# Patient Record
Sex: Male | Born: 1960 | Race: Black or African American | Hispanic: No | Marital: Married | Smoking: Current some day smoker
Health system: Southern US, Community
[De-identification: ages and names within clinical notes are randomized; demographics above are authoritative.]

## PROBLEM LIST (undated history)

## (undated) DIAGNOSIS — B1920 Unspecified viral hepatitis C without hepatic coma: Secondary | ICD-10-CM

---

## 2016-10-27 ENCOUNTER — Encounter (HOSPITAL_COMMUNITY): Payer: Self-pay | Admitting: Emergency Medicine

## 2016-10-27 ENCOUNTER — Emergency Department (HOSPITAL_COMMUNITY)
Admission: EM | Admit: 2016-10-27 | Discharge: 2016-10-27 | Disposition: A | Discharge status: Home / Self Care | Payer: BLUE CROSS/BLUE SHIELD | Attending: Emergency Medicine | Admitting: Emergency Medicine

## 2016-10-27 ENCOUNTER — Emergency Department (HOSPITAL_COMMUNITY): Payer: BLUE CROSS/BLUE SHIELD

## 2016-10-27 DIAGNOSIS — S39011A Strain of muscle, fascia and tendon of abdomen, initial encounter: Secondary | ICD-10-CM | POA: Diagnosis not present

## 2016-10-27 DIAGNOSIS — Y939 Activity, unspecified: Secondary | ICD-10-CM | POA: Diagnosis not present

## 2016-10-27 DIAGNOSIS — S3991XA Unspecified injury of abdomen, initial encounter: Secondary | ICD-10-CM | POA: Diagnosis present

## 2016-10-27 DIAGNOSIS — M25562 Pain in left knee: Secondary | ICD-10-CM | POA: Insufficient documentation

## 2016-10-27 DIAGNOSIS — Y929 Unspecified place or not applicable: Secondary | ICD-10-CM | POA: Diagnosis not present

## 2016-10-27 DIAGNOSIS — X58XXXA Exposure to other specified factors, initial encounter: Secondary | ICD-10-CM | POA: Insufficient documentation

## 2016-10-27 DIAGNOSIS — F17200 Nicotine dependence, unspecified, uncomplicated: Secondary | ICD-10-CM | POA: Diagnosis not present

## 2016-10-27 DIAGNOSIS — T148XXA Other injury of unspecified body region, initial encounter: Secondary | ICD-10-CM

## 2016-10-27 DIAGNOSIS — G8929 Other chronic pain: Secondary | ICD-10-CM | POA: Diagnosis not present

## 2016-10-27 DIAGNOSIS — Y999 Unspecified external cause status: Secondary | ICD-10-CM | POA: Diagnosis not present

## 2016-10-27 HISTORY — DX: Unspecified viral hepatitis C without hepatic coma: B19.20

## 2016-10-27 LAB — URINALYSIS, ROUTINE W REFLEX MICROSCOPIC
BILIRUBIN URINE: NEGATIVE
GLUCOSE, UA: NEGATIVE mg/dL
Hgb urine dipstick: NEGATIVE
KETONES UR: NEGATIVE mg/dL
LEUKOCYTES UA: NEGATIVE
NITRITE: NEGATIVE
PROTEIN: NEGATIVE mg/dL
Specific Gravity, Urine: 1.02 (ref 1.005–1.030)
pH: 5 (ref 5.0–8.0)

## 2016-10-27 MED ORDER — NAPROXEN 500 MG PO TABS: 500.0000 mg | ORAL_TABLET | Freq: Two times a day (BID) | ORAL | 0 refills | Status: AC

## 2016-10-27 NOTE — ED Notes (Signed)
Pt is in stable condition upon d/c and ambulates from ED. 

## 2016-10-27 NOTE — ED Provider Notes (Signed)
[facility]-EMERGENCY DEPT Provider Note    By signing my name below, I, Earmon Phoenix, attest that this documentation has been prepared under the direction and in the presence of Melburn Hake, PA-C. Electronically Signed: Earmon Phoenix, ED Scribe. 10/27/16. 2:04 PM.    History   Chief Complaint Chief Complaint  Patient presents with  . Knee Pain  . Groin Pain    The history is provided by the patient and medical records. No language interpreter was used.    Brent Edwards is a 56 y.o. male with PMHx of Hepatitis C who presents to the Emergency Department complaining of left knee pain that began approximately one month ago. He states the pain began worsening in the past two days. Denies any recent fall, injury or trauma. He reports associated swelling of the left knee. Pt reports he has had similar pain and swelling in the knee before and was evaluated in [facility] where he received pain medication with improvement of sxs. Denies fever, redness, numbness, tingling, weakness, decreased ROM.   He also reports intermittent right groin pain that has been ongoing for approximately one year. Sometimes the pain radiates into the right inguinal area and he reports some pain gets near his right testicle. He reports associated increased urination. He has not taken anything for pain relief. Pt denies modifying factors of any complaint. He denies redness, swelling or pain of the penis, dysuria, abdominal pain, nausea, vomiting, penile drainage, rectal pain or bleeding, fever, chills, numbness, tingling or weakness of the LLE. He denies any previous surgeries, trauma or injury to the knee.    Past Medical History:  Diagnosis Date  . Hepatitis C     There are no active problems to display for this patient.   History reviewed. No pertinent surgical history.     Home Medications    Prior to Admission medications   Medication Sig Start Date End Date Taking? Authorizing Provider  naproxen  (NAPROSYN) 500 MG tablet Take 1 tablet (500 mg total) by mouth 2 (two) times daily. 10/27/16   Barrett Henle, PA-C    Family History No family history on file.  Social History Social History  Substance Use Topics  . Smoking status: Current Some Day Smoker  . Smokeless tobacco: Never Used  . Alcohol use Not on file     Allergies   Patient has no known allergies.   Review of Systems Review of Systems  Constitutional: Negative for chills and fever.  Gastrointestinal: Negative for abdominal pain, anal bleeding, nausea and vomiting.  Genitourinary: Positive for frequency and testicular pain. Negative for discharge, dysuria, penile pain, penile swelling and scrotal swelling.  Musculoskeletal: Positive for arthralgias and joint swelling.  Neurological: Negative for weakness and numbness.     Physical Exam Updated Vital Signs BP 127/87 (BP Location: Left Arm)   Pulse 72   Temp 99.1 F (37.3 C) (Oral)   Resp 16   SpO2 97%   Physical Exam  Constitutional: He is oriented to person, place, and time. He appears well-developed and well-nourished.  HENT:  Head: Normocephalic and atraumatic.  Mouth/Throat: Uvula is midline and oropharynx is clear and moist. No oral lesions. No oropharyngeal exudate.  Eyes: Conjunctivae and EOM are normal. Right eye exhibits no discharge. Left eye exhibits no discharge. No scleral icterus.  Cardiovascular: Normal rate, regular rhythm, normal heart sounds and intact distal pulses.   Pulmonary/Chest: Effort normal and breath sounds normal. No respiratory distress. He has no wheezes. He has no rales. He  exhibits no tenderness.  Abdominal: Soft. Bowel sounds are normal. He exhibits no distension and no mass. There is no tenderness. There is no rebound and no guarding. No hernia. Hernia confirmed negative in the right inguinal area and confirmed negative in the left inguinal area.  Genitourinary: Testes normal and penis normal. Right testis shows no  mass, no swelling and no tenderness. Left testis shows no mass, no swelling and no tenderness. Circumcised. No penile erythema or penile tenderness. No discharge found.  Genitourinary Comments: No tenderness to palpation over bilateral inguinal regions. No rash or lesions present.  Musculoskeletal: He exhibits tenderness. He exhibits no edema or deformity.       Left knee: He exhibits swelling. He exhibits normal range of motion, no effusion, no ecchymosis, no deformity, no laceration, no erythema, normal alignment, no LCL laxity, normal patellar mobility and no MCL laxity.       Left ankle: Normal.       Left lower leg: Normal.       Left foot: Normal.  Mild tenderness to palpation and swelling of left knee.  Lymphadenopathy: No inguinal adenopathy noted on the right or left side.  Neurological: He is alert and oriented to person, place, and time.  Skin: Skin is warm and dry. Capillary refill takes less than 2 seconds.  Nursing note and vitals reviewed.    ED Treatments / Results  DIAGNOSTIC STUDIES: Oxygen Saturation is 97% on RA, normal by my interpretation.   COORDINATION OF CARE: 1:26 PM- Will order urinalysis. Pt verbalizes understanding and agrees to plan.  Medications - No data to display  Labs (all labs ordered are listed, but only abnormal results are displayed) Labs Reviewed  URINALYSIS, ROUTINE W REFLEX MICROSCOPIC    EKG  EKG Interpretation None       Radiology Dg Knee Complete 4 Views Left  Result Date: 10/27/2016 CLINICAL DATA:  Left knee pain and swelling. No known injury. Initial encounter. EXAM: LEFT KNEE - COMPLETE 4+ VIEW COMPARISON:  None. FINDINGS: No evidence of fracture, dislocation, or joint effusion. No evidence of arthropathy or other focal bone abnormality. Soft tissues are unremarkable. IMPRESSION: Negative exam. Electronically Signed   By: Drusilla Kanner M.D.   On: 10/27/2016 12:38    Procedures Procedures (including critical care  time)  Medications Ordered in ED Medications - No data to display   Initial Impression / Assessment and Plan / ED Course  I have reviewed the triage vital signs and the nursing notes.  Pertinent labs & imaging results that were available during my care of the patient were reviewed by me and considered in my medical decision making (see chart for details).  Clinical Course     Pt presents with knee pain which he has had for the past month. Denies any recent fall, injury or trauma. VSS. Exam revealed mild tenderness and swelling to left knee, FROM with 5/5 strength, no ligament laxity present. No warmth, erythema or joint effusion present. Pt is able ambulate, no bony abnormality or deformity, no erythema or excessive heat, no evidence of cellulitis, DVT, or septic joint  Patient X-Ray negative for obvious fracture or dislocation.  Pt advised to follow up with orthopedics. Patient given NSAIDs while in ED, conservative therapy recommended and discussed. Patient will be discharged home & is agreeable with above plan. Returns precautions discussed. Pt appears safe for discharge.  Pt also presents with intermittent right groin pain for the past year with urinary frequency. Denies fever, penile d/c, penile  or testicular pain/swelling, abdominal pain, rash. VSS. Exam unremarkable, no abdominal tenderness. GU exam unremarkable, no rash, swelling, penile discharge or tenderness present over groin region. No hernia present. UA unremarkable. No tenderness to palpation of the testes or epididymis to suggest testicular torsion, orchitis or epididymitis. Do not suspect prostatitis or STD as pt is without rectal pain and UA unremarkable.  Suspect reported groin pain may be due to muscle strain. Plan to d/c pt home with symptomatic tx and outpatient PCP followup. Discussed results and plan for d/c with pt. Discussed return precautions.     I personally performed the services described in this documentation, which  was scribed in my presence. The recorded information has been reviewed and is accurate.   Final Clinical Impressions(s) / ED Diagnoses   Final diagnoses:  Chronic pain of left knee  Muscle strain    New Prescriptions Discharge Medication List as of 10/27/2016  2:53 PM    START taking these medications   Details  naproxen (NAPROSYN) 500 MG tablet Take 1 tablet (500 mg total) by mouth 2 (two) times daily., Starting Fri 10/27/2016, Print         KeachiNicole Elizabeth Daun Rens, PA-C 10/27/16 Paulo [id number]    Shaune Pollackameron Isaacs, MD 10/28/16 [phone]

## 2016-10-27 NOTE — Discharge Instructions (Signed)
Take your medication as prescribed as needed for pain relief. I recommend resting, elevating and applying ice to her left knee for 15-20 minutes 3-4 times daily. I recommend following up with your primary care provider in the next week to have her urine rechecked if he continued having urinary frequency. Please return to the Emergency Department if symptoms worsen or new onset of fever, abdominal pain, vomiting, rash, penile or testicular pain/swelling, penile discharge, pain with urination, blood in urine, numbness, tingling, weakness.

## 2016-10-27 NOTE — ED Triage Notes (Signed)
Groin pain x 4 months and  Left knee pain swollen x 3 weeks denies injury

## 2016-11-11 ENCOUNTER — Emergency Department (HOSPITAL_COMMUNITY)
Admission: EM | Admit: 2016-11-11 | Discharge: 2016-11-11 | Disposition: A | Discharge status: Home / Self Care | Payer: BLUE CROSS/BLUE SHIELD | Attending: Emergency Medicine | Admitting: Emergency Medicine

## 2016-11-11 ENCOUNTER — Encounter (HOSPITAL_COMMUNITY): Payer: Self-pay

## 2016-11-11 DIAGNOSIS — M25562 Pain in left knee: Secondary | ICD-10-CM

## 2016-11-11 DIAGNOSIS — Z79899 Other long term (current) drug therapy: Secondary | ICD-10-CM | POA: Insufficient documentation

## 2016-11-11 DIAGNOSIS — F17200 Nicotine dependence, unspecified, uncomplicated: Secondary | ICD-10-CM | POA: Insufficient documentation

## 2016-11-11 MED ORDER — TRAMADOL HCL 50 MG PO TABS: 50.0000 mg | ORAL_TABLET | Freq: Four times a day (QID) | ORAL | 0 refills | Status: AC | PRN

## 2016-11-11 NOTE — Discharge Instructions (Signed)
Medications: Tramadol  Treatment: Take tramadol every 6 hours as needed for severe pain. Do not drive or operate machinery when taking this medication. You can take ibuprofen or Naprosyn as prescribed over-the-counter when you're not taking tramadol. Use ice 3-4 times daily alternating 20 minutes on, 20 minutes off. Wear knee sleeve at all times except when bathing for support and to help with swelling. Elevate your leg whenever you are not walking on it.  Follow-up: Please follow-up with the orthopedic doctor, Dr. Linna CapriceSwinteck, for further evaluation and treatment of your knee pain. Please return to the emergency department if you develop any new or worsening symptoms.

## 2016-11-11 NOTE — ED Provider Notes (Signed)
[facility]-EMERGENCY DEPT Provider Note   CSN: [id number] Arrival date & time: 11/11/16  [id number]   By signing my name below, I, Orpah CobbMaurice Copeland, attest that this documentation has been prepared under the direction and in the presence of Buel ReamAlexandra Joscelyn Hardrick, PA-C. Electronically Signed: Orpah CobbMaurice Copeland , ED Scribe. 10/30/16. 4:20 PM.    History   Chief Complaint No chief complaint on file.   HPI  Brent Edwards is a 56 y.o. male who presents to the Emergency Department complaining of constant, mild to moderate L knee pain with sudden onset x1 month. Pt states that he has had ongoing swelling to the L knee for the past x1 month.  Pt reports heartburn feeling with use of the prescribed medication, naprosyn. Pt states that the medication he was prescribed x2 weeks ago has provided no relief. Pt denies fever, numbness, tingling, chest pain, nausea, vomiting, abdominal pain, urinary symptoms. Of note, pt states that he has not found a PCP due to his work schedule.  Denies injury or trauma.   The history is provided by the patient. No language interpreter was used.    Past Medical History:  Diagnosis Date  . Hepatitis C     There are no active problems to display for this patient.   History reviewed. No pertinent surgical history.     Home Medications    Prior to Admission medications   Medication Sig Start Date End Date Taking? Authorizing Provider  naproxen (NAPROSYN) 500 MG tablet Take 1 tablet (500 mg total) by mouth 2 (two) times daily. 10/27/16   Barrett HenleNicole Elizabeth Nadeau, PA-C  traMADol (ULTRAM) 50 MG tablet Take 1 tablet (50 mg total) by mouth every 6 (six) hours as needed. 11/11/16   Emi HolesAlexandra M Shiela Bruns, PA-C    Family History No family history on file.  Social History Social History  Substance Use Topics  . Smoking status: Current Some Day Smoker  . Smokeless tobacco: Never Used  . Alcohol use Not on file     Allergies   Patient has no known allergies.   Review of  Systems Review of Systems  Cardiovascular: Negative for chest pain.  Gastrointestinal: Negative for abdominal pain, nausea and vomiting.  Musculoskeletal: Positive for arthralgias (L knee) and joint swelling.  Neurological: Negative for numbness.     Physical Exam Updated Vital Signs BP 150/91 (BP Location: Left Arm)   Pulse 65   Temp 97.5 F (36.4 C) (Oral)   Resp 18   Ht 6' (1.829 m)   Wt 97.5 kg   SpO2 100%   BMI 29.16 kg/m   Physical Exam  Constitutional: He appears well-developed and well-nourished. No distress.  HENT:  Head: Normocephalic and atraumatic.  Mouth/Throat: Oropharynx is clear and moist. No oropharyngeal exudate.  Eyes: Conjunctivae are normal. Pupils are equal, round, and reactive to light. Right eye exhibits no discharge. Left eye exhibits no discharge. No scleral icterus.  Neck: Normal range of motion. Neck supple. No thyromegaly present.  Cardiovascular: Normal rate, regular rhythm, normal heart sounds and intact distal pulses.  Exam reveals no gallop and no friction rub.   No murmur heard. Pulmonary/Chest: Effort normal and breath sounds normal. No stridor. No respiratory distress. He has no wheezes. He has no rales.  Abdominal: Soft. Bowel sounds are normal. He exhibits no distension. There is no tenderness. There is no rebound and no guarding.  Musculoskeletal: He exhibits no edema.  L knee: Mild superior swelling with mild warmth, tenderness to anterior and lateral joint line;  negative anterior/posterior drawer, no laxity with varus or valgus stress, but some pain over the medial joint line, no erythema or excessive heating; full passive range of motion without pain  Lymphadenopathy:    He has no cervical adenopathy.  Neurological: He is alert. Coordination normal.  Skin: Skin is warm and dry. No rash noted. He is not diaphoretic. No pallor.  Psychiatric: He has a normal mood and affect.  Nursing note and vitals reviewed.    ED Treatments /  Results   DIAGNOSTIC STUDIES: Oxygen Saturation is 100% on RA, normal by my interpretation.   COORDINATION OF CARE: 12:41 PM-Discussed next steps with pt. Pt verbalized understanding and is agreeable with the plan.    Labs (all labs ordered are listed, but only abnormal results are displayed) Labs Reviewed - No data to display  EKG  EKG Interpretation None       Radiology No results found.  Procedures Procedures (including critical care time)  Medications Ordered in ED Medications - No data to display   Initial Impression / Assessment and Plan / ED Course  I have reviewed the triage vital signs and the nursing notes.  Pertinent labs & imaging results that were available during my care of the patient were reviewed by me and considered in my medical decision making (see chart for details).     Patient returns with knee pain. Negative imaging 2 weeks ago. Patient given Naprosyn without relief. Suspect possible patellofemoral syndrome or meniscal injury. No signs of septic joint or other emergent pathology. Patient given knee sleeve today. Discharge home with tramadol. I reviewed and see narcotic database and found no discrepancies. Patient encouraged to use ice and elevation. Encouraged to follow up with orthopedics as soon as possible for further evaluation and treatment. Return precautions discussed. Patient understands and agrees with plan. Patient vitals stable throughout ED course and discharged in satisfactory condition.  Final Clinical Impressions(s) / ED Diagnoses   Final diagnoses:  Acute pain of left knee    New Prescriptions Discharge Medication List as of 11/11/2016 10:45 AM    START taking these medications   Details  traMADol (ULTRAM) 50 MG tablet Take 1 tablet (50 mg total) by mouth every 6 (six) hours as needed., Starting Sat 11/11/2016, Print      I personally performed the services described in this documentation, which was scribed in my presence.  The recorded information has been reviewed and is accurate.     Emi Holes, PA-C 11/11/16 [id number]    Alvira Monday, MD 11/14/16 [phone]

## 2016-11-11 NOTE — ED Triage Notes (Signed)
Patient here with \\ongoing  left knee pain with ambulation and swelling. Seen 2 weeks ago and had negative xray, denies new trauma

## 2017-08-04 ENCOUNTER — Emergency Department (HOSPITAL_COMMUNITY): Payer: Self-pay

## 2017-08-04 ENCOUNTER — Encounter (HOSPITAL_COMMUNITY): Payer: Self-pay | Admitting: *Deleted

## 2017-08-04 ENCOUNTER — Emergency Department (HOSPITAL_COMMUNITY)
Admission: EM | Admit: 2017-08-04 | Discharge: 2017-08-04 | Disposition: A | Discharge status: Home / Self Care | Payer: Self-pay | Attending: Emergency Medicine | Admitting: Emergency Medicine

## 2017-08-04 DIAGNOSIS — Z79899 Other long term (current) drug therapy: Secondary | ICD-10-CM | POA: Insufficient documentation

## 2017-08-04 DIAGNOSIS — F17200 Nicotine dependence, unspecified, uncomplicated: Secondary | ICD-10-CM | POA: Insufficient documentation

## 2017-08-04 DIAGNOSIS — G44209 Tension-type headache, unspecified, not intractable: Secondary | ICD-10-CM | POA: Insufficient documentation

## 2017-08-04 DIAGNOSIS — N50819 Testicular pain, unspecified: Secondary | ICD-10-CM | POA: Insufficient documentation

## 2017-08-04 LAB — URINALYSIS, ROUTINE W REFLEX MICROSCOPIC
BILIRUBIN URINE: NEGATIVE
Bacteria, UA: NONE SEEN
GLUCOSE, UA: NEGATIVE mg/dL
Ketones, ur: NEGATIVE mg/dL
Leukocytes, UA: NEGATIVE
Nitrite: NEGATIVE
Protein, ur: NEGATIVE mg/dL
Specific Gravity, Urine: 1.02 (ref 1.005–1.030)
Squamous Epithelial / LPF: NONE SEEN
pH: 5 (ref 5.0–8.0)

## 2017-08-04 LAB — CBC WITH DIFFERENTIAL/PLATELET
Basophils Absolute: 0 10*3/uL (ref 0.0–0.1)
Basophils Relative: 0 %
EOS ABS: 0.2 10*3/uL (ref 0.0–0.7)
EOS PCT: 3 %
HCT: 38.3 % — ABNORMAL LOW (ref 39.0–52.0)
HEMOGLOBIN: 12.2 g/dL — AB (ref 13.0–17.0)
LYMPHS ABS: 2.2 10*3/uL (ref 0.7–4.0)
LYMPHS PCT: 28 %
MCH: 31 pg (ref 26.0–34.0)
MCHC: 31.9 g/dL (ref 30.0–36.0)
MCV: 97.5 fL (ref 78.0–100.0)
MONOS PCT: 6 %
Monocytes Absolute: 0.5 10*3/uL (ref 0.1–1.0)
Neutro Abs: 5 10*3/uL (ref 1.7–7.7)
Neutrophils Relative %: 63 %
Platelets: 248 10*3/uL (ref 150–400)
RBC: 3.93 MIL/uL — AB (ref 4.22–5.81)
RDW: 12.9 % (ref 11.5–15.5)
WBC: 8 10*3/uL (ref 4.0–10.5)

## 2017-08-04 LAB — BASIC METABOLIC PANEL
Anion gap: 8 (ref 5–15)
BUN: 13 mg/dL (ref 6–20)
CO2: 27 mmol/L (ref 22–32)
Calcium: 9.2 mg/dL (ref 8.9–10.3)
Chloride: 100 mmol/L — ABNORMAL LOW (ref 101–111)
Creatinine, Ser: 1.18 mg/dL (ref 0.61–1.24)
GFR calc Af Amer: >60 mL/min (ref >60)
GFR calc non Af Amer: >60 mL/min (ref >60)
Glucose, Bld: 94 mg/dL (ref 65–99)
POTASSIUM: 3.8 mmol/L (ref 3.5–5.1)
SODIUM: 135 mmol/L (ref 135–145)

## 2017-08-04 NOTE — ED Provider Notes (Signed)
[facility] EMERGENCY DEPARTMENT Provider Note   CSN: [id number] Arrival date & time: 08/04/17  4098     History   Chief Complaint Chief Complaint  Patient presents with  . Testicle Pain    HPI Brent Edwards is a 56 y.o. male.  Patient is a 56 year old male with a history of hepatitis C who presents with multiple complaints. His main complaint is some pain in his right testicular and inguinal area. This is been going on for about 4 months.  He states it's been constant throbbing. He denies any lumps or bulges in his inguinal area. He has urinary frequency but denies any burning or pain on urination. No pain with bowel movements. He also has low back pain. He describes as aching across his low back it's also been going on for several months. He does have a history of chronic back problems with prior back surgery. He denies any radiation down his legs. No numbness or weakness to his extremities. No loss of bowel or bladder control. No fevers. It's worse with movement. He also states he's been having constant daily headaches for about 4 months. It's a bifrontal-type headache. Occasionally has some tingling in his right hand. No weakness in his extremities. No associated neck pain. No vision changes. No speech deficits. No recent head trauma. He states it's constant and unrelenting. He denies taking any over-the-counter medications for symptoms.      Past Medical History:  Diagnosis Date  . Hepatitis C     There are no active problems to display for this patient.   History reviewed. No pertinent surgical history.     Home Medications    Prior to Admission medications   Medication Sig Start Date End Date Taking? Authorizing Provider  Glucosamine-Chondroit-MSM-C-Mn (FLEXI JOINT PO) Take 1 tablet by mouth daily.   Yes [provider]  Omega-3 Fatty Acids (OMEGA 3 PO) Take 1 tablet by mouth 2 (two) times daily.   Yes [provider]    naproxen (NAPROSYN) 500 MG tablet Take 1 tablet (500 mg total) by mouth 2 (two) times daily. Patient not taking: Reported on 08/04/2017 10/27/16   Barrett Henle, PA-C  traMADol (ULTRAM) 50 MG tablet Take 1 tablet (50 mg total) by mouth every 6 (six) hours as needed. Patient not taking: Reported on 08/04/2017 11/11/16   Emi Holes, PA-C    Family History History reviewed. No pertinent family history.  Social History Social History  Substance Use Topics  . Smoking status: Current Some Day Smoker  . Smokeless tobacco: Never Used  . Alcohol use Not on file     Allergies   Other   Review of Systems Review of Systems  Constitutional: Negative for chills, diaphoresis, fatigue and fever.  HENT: Negative for congestion, rhinorrhea and sneezing.   Eyes: Negative.   Respiratory: Negative for cough, chest tightness and shortness of breath.   Cardiovascular: Negative for chest pain and leg swelling.  Gastrointestinal: Negative for abdominal pain, blood in stool, diarrhea, nausea and vomiting.  Genitourinary: Positive for frequency and testicular pain. Negative for difficulty urinating, flank pain and hematuria.  Musculoskeletal: Positive for back pain. Negative for arthralgias.  Skin: Negative for rash.  Neurological: Positive for numbness and headaches. Negative for dizziness, speech difficulty and weakness.     Physical Exam Updated Vital Signs BP (!) 142/89   Pulse 64   Temp 98.3 F (36.8 C) (Oral)   Resp 16   Ht 6' (1.829 m)  Wt 83.9 kg (185 lb)   SpO2 99%   BMI 25.09 kg/m   Physical Exam  Constitutional: He is oriented to person, place, and time. He appears well-developed and well-nourished.  HENT:  Head: Normocephalic and atraumatic.  Eyes: Pupils are equal, round, and reactive to light.  Neck: Normal range of motion. Neck supple.  No meningismus  Cardiovascular: Normal rate, regular rhythm and normal heart sounds.   Pulmonary/Chest: Effort normal  and breath sounds normal. No respiratory distress. He has no wheezes. He has no rales. He exhibits no tenderness.  Abdominal: Soft. Bowel sounds are normal. There is no tenderness. There is no rebound and no guarding.  Genitourinary:  Genitourinary Comments: Patient has normal-appearing external male genitalia. There is tenderness in the right testicle. No masses are noted. No hernias are palpated.  Musculoskeletal: Normal range of motion. He exhibits no edema.  Patient has some mild generalized soreness to his most which are in the lumbar region bilaterally, negative straight leg raise bilaterally. Pedal pulses are intact.  Lymphadenopathy:    He has no cervical adenopathy.  Neurological: He is alert and oriented to person, place, and time.  Motor 5/5 all extremities Sensation grossly intact to LT all extremities Finger to Nose intact, no pronator drift CN II-XII grossly intact    Skin: Skin is warm and dry. No rash noted.  Psychiatric: He has a normal mood and affect.     ED Treatments / Results  Labs (all labs ordered are listed, but only abnormal results are displayed) Labs Reviewed  URINALYSIS, ROUTINE W REFLEX MICROSCOPIC - Abnormal; Notable for the following:       Result Value   Hgb urine dipstick SMALL (*)    All other components within normal limits  BASIC METABOLIC PANEL - Abnormal; Notable for the following:    Chloride 100 (*)    All other components within normal limits  CBC WITH DIFFERENTIAL/PLATELET - Abnormal; Notable for the following:    RBC 3.93 (*)    Hemoglobin 12.2 (*)    HCT 38.3 (*)    All other components within normal limits  HIV ANTIBODY (ROUTINE TESTING)    EKG  EKG Interpretation None       Radiology Ct Head Wo Contrast  Result Date: 08/04/2017 CLINICAL DATA:  Chronic headache. EXAM: CT HEAD WITHOUT CONTRAST TECHNIQUE: Contiguous axial images were obtained from the base of the skull through the vertex without intravenous contrast.  COMPARISON:  None. FINDINGS: Brain: No evidence of acute infarction, hemorrhage, hydrocephalus, extra-axial collection, or mass lesion/mass effect. Vascular:  No hyperdense vessel or other acute findings. Skull: No evidence of fracture or other significant bone abnormality. Sinuses/Orbits:  No acute findings. Other: None. IMPRESSION: Negative noncontrast head CT. Electronically Signed   By: Myles Rosenthal M.D.   On: 08/04/2017 11:02   US Scrotum  Result Date: 08/04/2017 CLINICAL DATA:  Right testicular pain and tenderness 4 months. EXAM: SCROTAL ULTRASOUND DOPPLER ULTRASOUND OF THE TESTICLES TECHNIQUE: Complete ultrasound examination of the testicles, epididymis, and other scrotal structures was performed. Color and spectral Doppler ultrasound were also utilized to evaluate blood flow to the testicles. COMPARISON:  None. FINDINGS: Right testicle Measurements: 4.6 x 2.7 x 3.5 cm. No mass or microlithiasis visualized. Left testicle Measurements: 4.1 x 3.0 x 2.7 cm. No mass or microlithiasis visualized. Right epididymis:  Normal in size and appearance. Left epididymis: A 1.6 cm spermatocele or cyst is seen in the left epididymal head. Hydrocele:  None visualized. Varicocele: Small bilateral  varicoceles which show increased blood flow during Valsalva maneuver. Pulsed Doppler interrogation of both testes demonstrates normal low resistance arterial and venous waveforms bilaterally. IMPRESSION: No evidence of testicular mass or torsion. Small bilateral varicoceles. 1.6 cm cyst or spermatocele in the left epididymal head. Electronically Signed   By: Myles RosenthalJohn  Stahl M.D.   On: 08/04/2017 11:00   [place] Art/ven Flow Abd Pelv Doppler  Result Date: 08/04/2017 CLINICAL DATA:  Right testicular pain and tenderness 4 months. EXAM: SCROTAL ULTRASOUND DOPPLER ULTRASOUND OF THE TESTICLES TECHNIQUE: Complete ultrasound examination of the testicles, epididymis, and other scrotal structures was performed. Color and spectral Doppler  ultrasound were also utilized to evaluate blood flow to the testicles. COMPARISON:  None. FINDINGS: Right testicle Measurements: 4.6 x 2.7 x 3.5 cm. No mass or microlithiasis visualized. Left testicle Measurements: 4.1 x 3.0 x 2.7 cm. No mass or microlithiasis visualized. Right epididymis:  Normal in size and appearance. Left epididymis: A 1.6 cm spermatocele or cyst is seen in the left epididymal head. Hydrocele:  None visualized. Varicocele: Small bilateral varicoceles which show increased blood flow during Valsalva maneuver. Pulsed Doppler interrogation of both testes demonstrates normal low resistance arterial and venous waveforms bilaterally. IMPRESSION: No evidence of testicular mass or torsion. Small bilateral varicoceles. 1.6 cm cyst or spermatocele in the left epididymal head. Electronically Signed   By: Myles RosenthalJohn  Stahl M.D.   On: 08/04/2017 11:00    Procedures Procedures (including critical care time)  Medications Ordered in ED Medications - No data to display   Initial Impression / Assessment and Plan / ED Course  I have reviewed the triage vital signs and the nursing notes.  Pertinent labs & imaging results that were available during my care of the patient were reviewed by me and considered in my medical decision making (see chart for details).     Patient is a [age] male who presents with multiple complaints. He has testicular pain. There is no hernia palpated. No evidence of torsion. No evidence of epididymitis. This is been going on for about 4 months. Don't feel that antibiotics are indicated. His urine does not appear to be infected. He denies any penile discharge. He does request an HIV test but denies any other symptoms that would be more consistent for an STD. He was given a urology referral for follow-up if his symptoms continue. He also has back pain which seems more chronic in nature. He has no neurologic deficits or signs of cauda equina. No radicular symptoms. This can be  managed symptomatically. He also has a four-month history of bifrontal headache. No neurologic deficits however given the ongoing unrelenting nature of the symptoms, a head CT was performed which shows no evidence of mass. He was discharged home in good condition. I did stress the need for primary care establishment. He was given a resource guide for possible outpatient follow-up. Return precautions were given.  Final Clinical Impressions(s) / ED Diagnoses   Final diagnoses:  Testicle pain  Tension-type headache, not intractable, unspecified chronicity pattern    New Prescriptions New Prescriptions   No medications on file     Rolan BuccoBelfi, Ransom Nickson, MD 08/04/17 1240

## 2017-08-04 NOTE — ED Notes (Signed)
Pt to US.

## 2017-08-04 NOTE — Discharge Instructions (Signed)
PLEASE TRY TO ESTABLISH CARE WITH A PRIMARY CARE DOCTOR.  MAKE APPOINTMENT WITH A UROLOGIST IF YOU TESTICULAR PAIN CONTINUES.  RETURN HERE IF YOU HAVE WORSENING SYMPTOMS.

## 2017-08-04 NOTE — ED Triage Notes (Signed)
PT reports testicular pain for months . Pt denies any injury. Pt reports frquency .

## 2017-08-05 LAB — HIV ANTIBODY (ROUTINE TESTING W REFLEX): HIV Screen 4th Generation wRfx: NONREACTIVE

## 2019-01-18 IMAGING — US US SCROTUM
1 series · 14 of 25 positions shown · non-contrast
Comparison: None.

CLINICAL DATA: Right testicular pain and tenderness 4 months.

EXAM:
SCROTAL ULTRASOUND
DOPPLER ULTRASOUND OF THE TESTICLES
TECHNIQUE: Complete ultrasound examination of the testicles, epididymis, and
other scrotal structures was performed. Color and spectral Doppler
ultrasound were also utilized to evaluate blood flow to the
testicles.

[Series 1: us scrotum · 0.06mm/px · 14 of 59 slices shown]
[im 1/59]
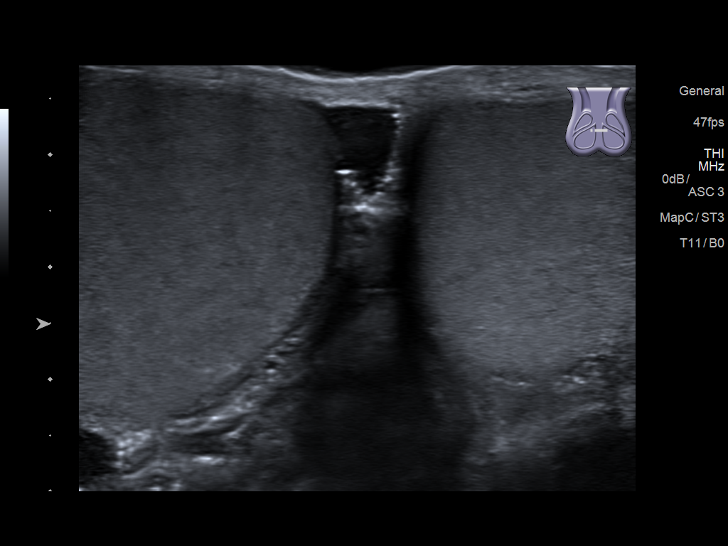
[im 5/59]
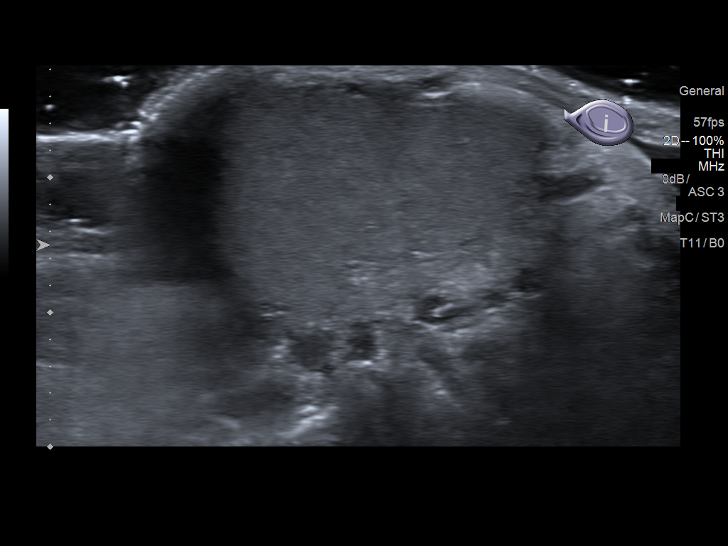
[im 10/59]
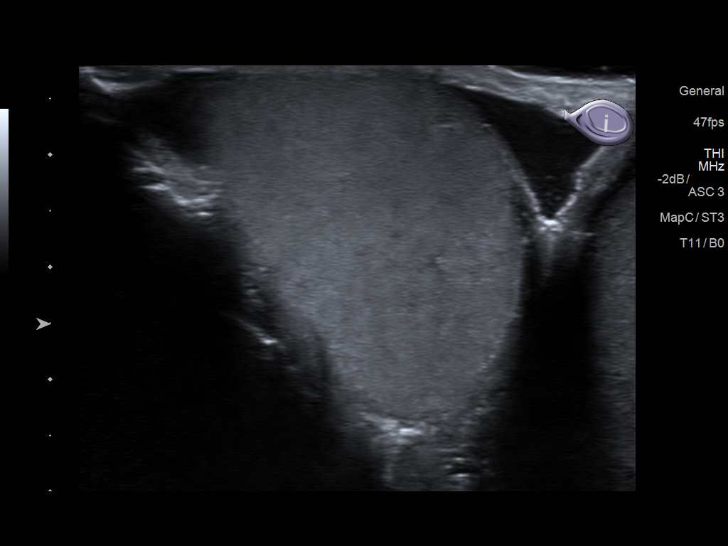
[im 15/59]
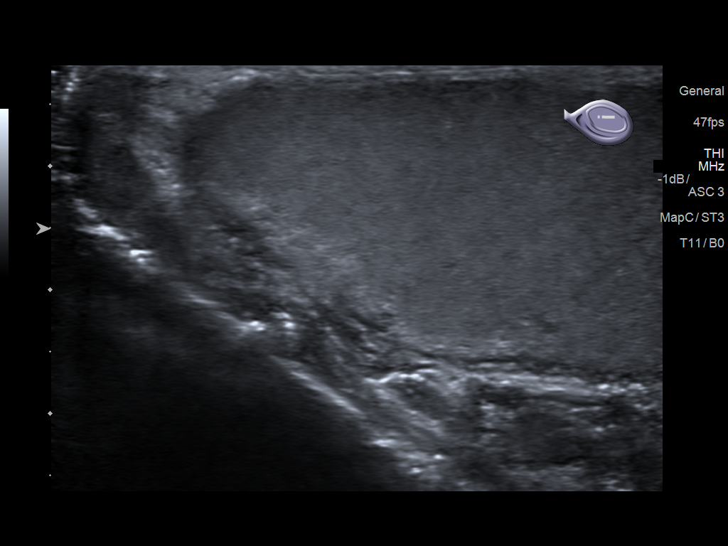
[im 20/59]
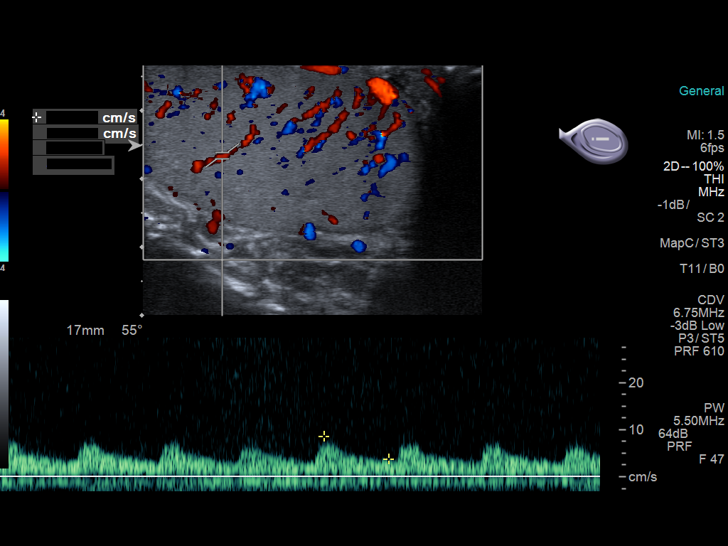
[im 22/59]
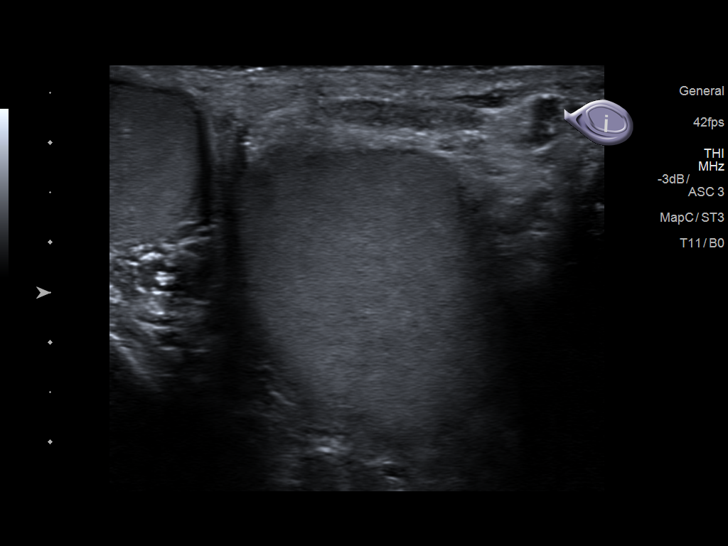
[im 27/59]
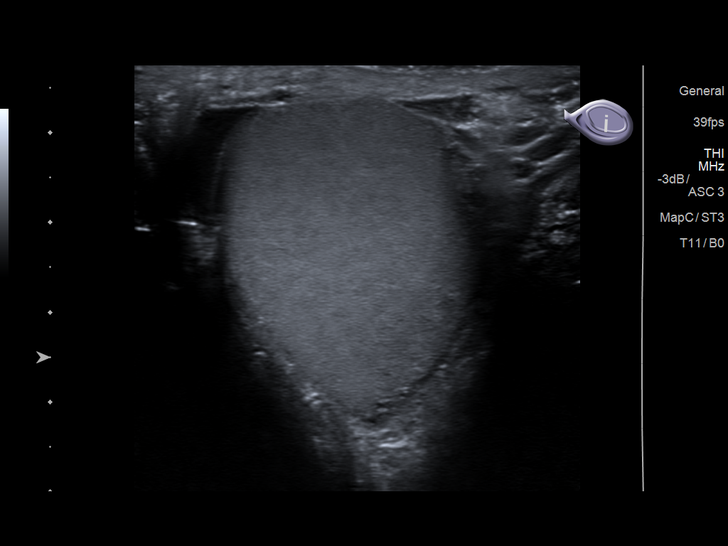
[im 32/59]
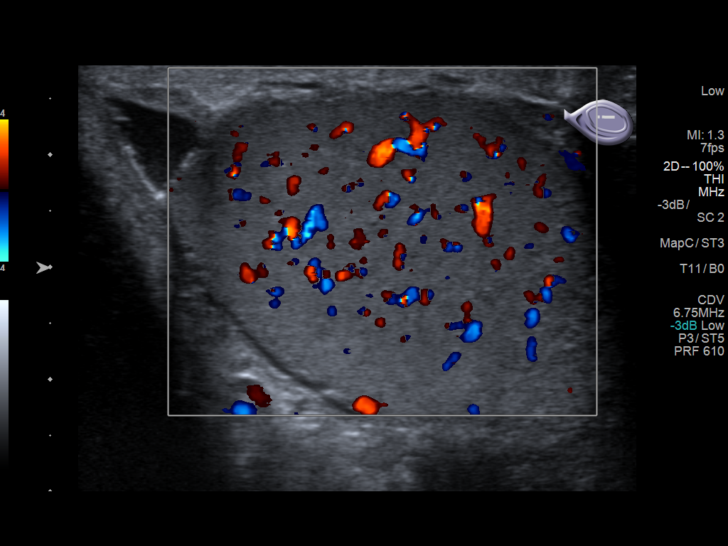
[im 37/59]
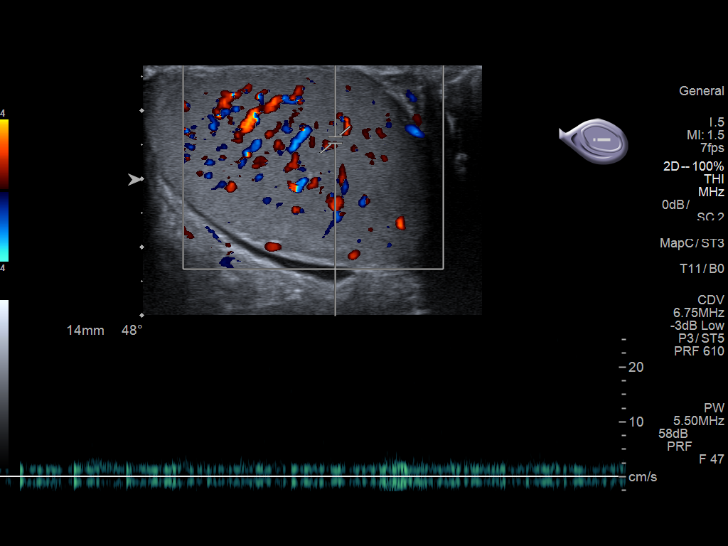
[im 39/59]
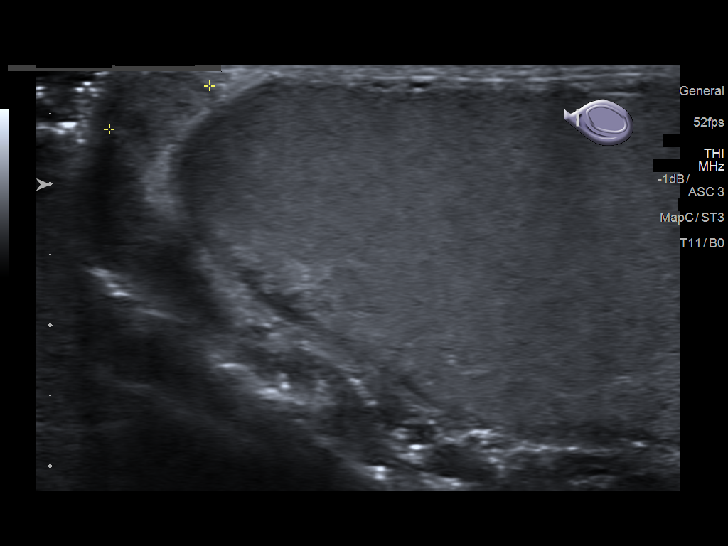
[im 44/59]
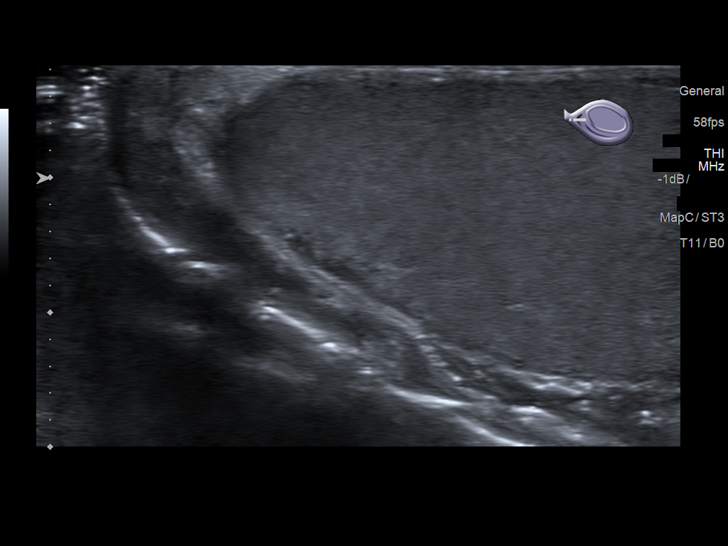
[im 49/59]
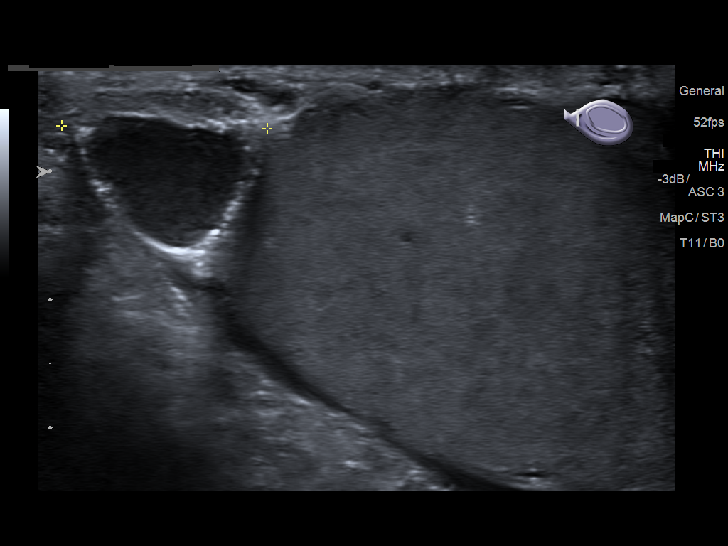
[im 54/59]
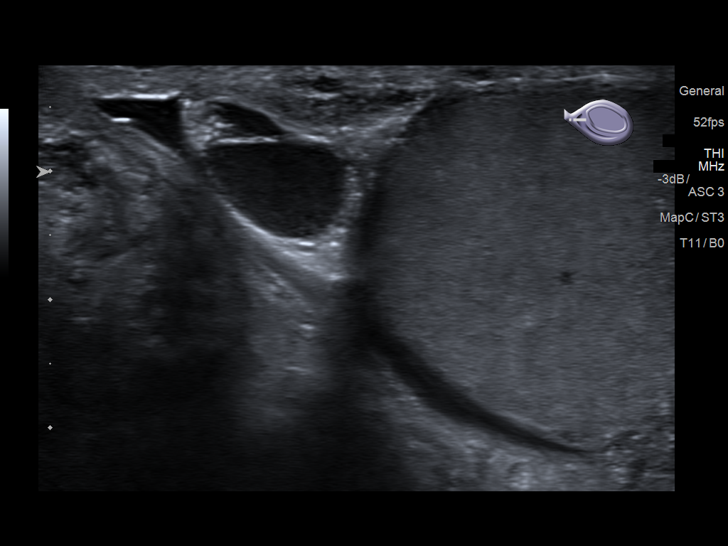
[im 59/59]
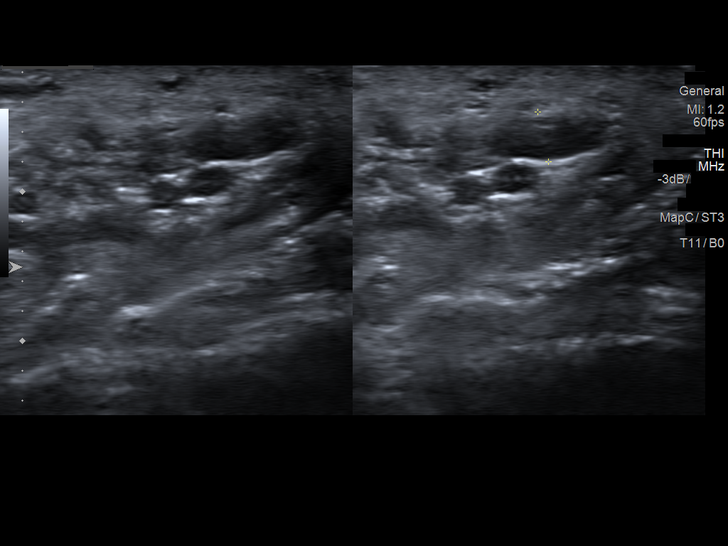

[14 of 25 positions shown; findings below may reference images not displayed]

FINDINGS: Right testicle

Measurements: 4.6 x 2.7 x 3.5 cm. No mass or microlithiasis
visualized.

Left testicle

Measurements: 4.1 x 3.0 x 2.7 cm. No mass or microlithiasis
visualized.

Right epididymis:  Normal in size and appearance.

Left epididymis: A 1.6 cm spermatocele or cyst is seen in the left
epididymal head.

Hydrocele:  None visualized.

Varicocele: Small bilateral varicoceles which show increased blood
flow during Valsalva maneuver.

Pulsed Doppler interrogation of both testes demonstrates normal low
resistance arterial and venous waveforms bilaterally.
IMPRESSION: No evidence of testicular mass or torsion.

Small bilateral varicoceles.

1.6 cm cyst or spermatocele in the left epididymal head.

## 2019-01-25 IMAGING — CT CT HEAD W/O CM
4 series · 17 of 47 positions shown, 19 images · non-contrast
Comparison: None.

CLINICAL DATA: Chronic headache.

EXAM:
CT HEAD WITHOUT CONTRAST
TECHNIQUE: Contiguous axial images were obtained from the base of the skull
through the vertex without intravenous contrast.

[Series 3: head without · axial · non-contrast · 0.43mm/px · z∈[-63,+62]mm · 7 of 35 slices shown, 9 images]
[im 5/35  brain]
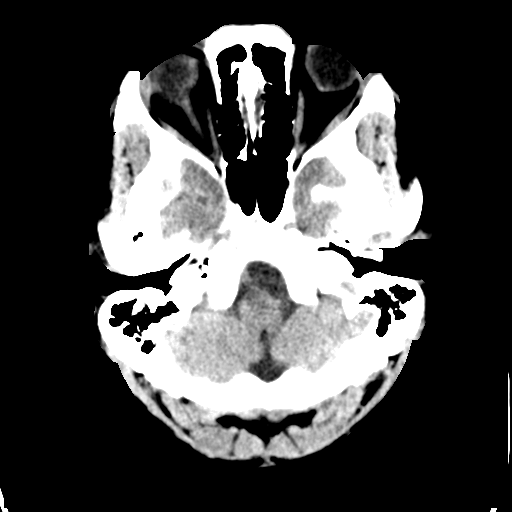
[im 5/35  bone]
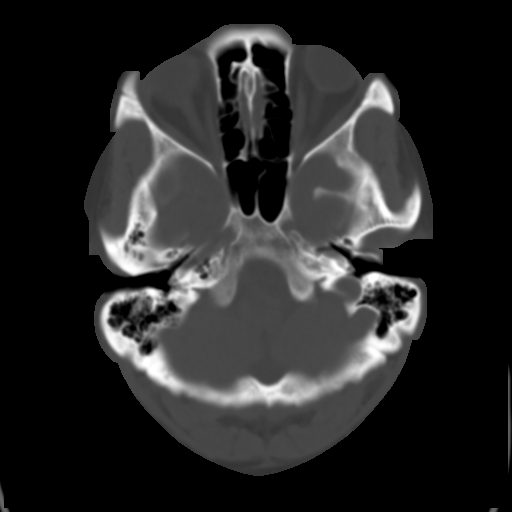
[im 9/35  brain]
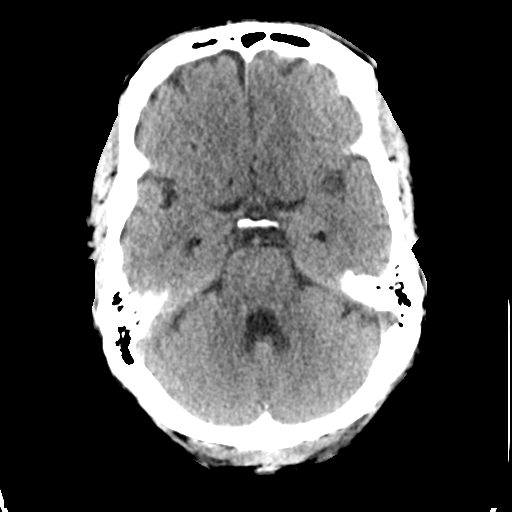
[im 13/35  brain]
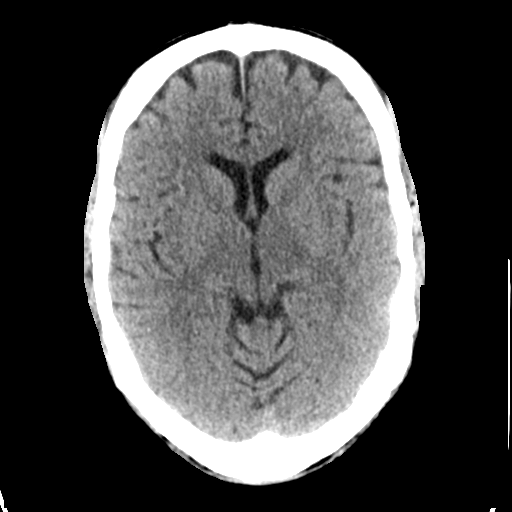
[im 18/35  brain]
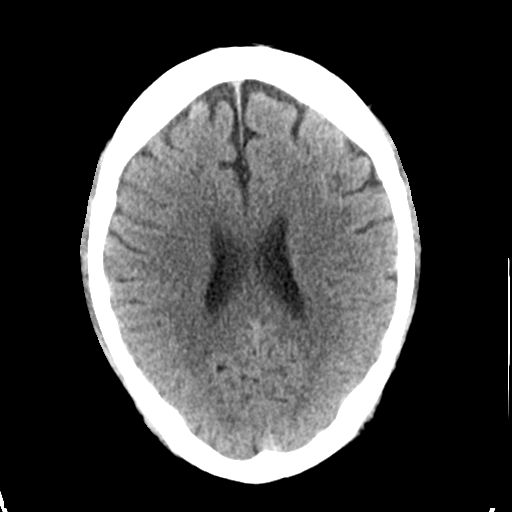
[im 22/35  brain]
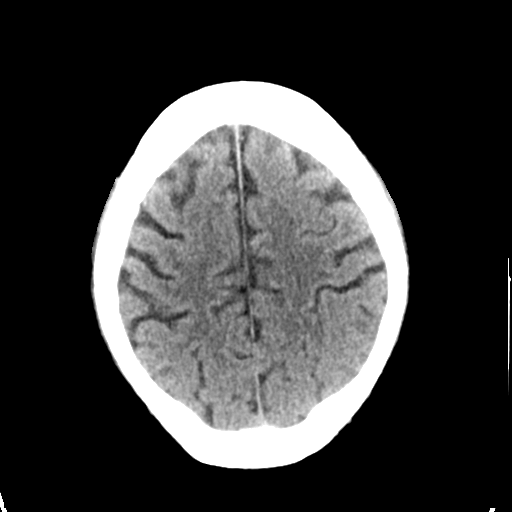
[im 22/35  bone]
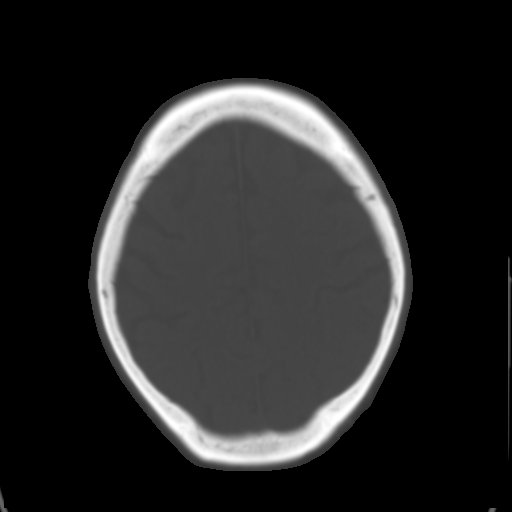
[im 26/35  brain]
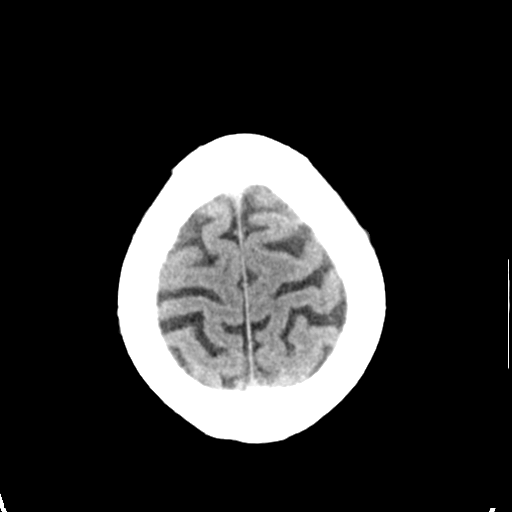
[im 30/35  brain]
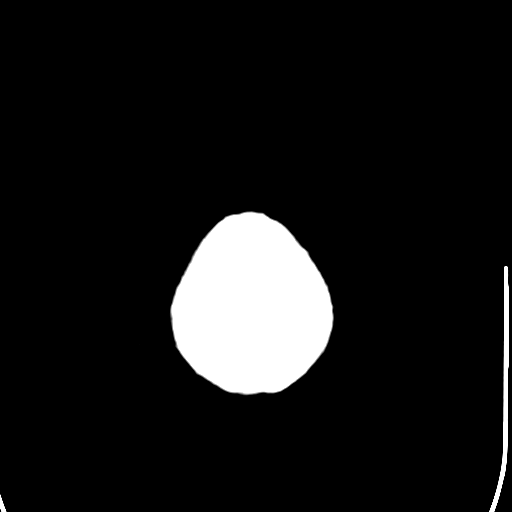

[Series 4: head bone · axial · 0.43mm/px · z∈[-67,-5]mm · 4 of 88 slices shown]
[im 9/88  bone]
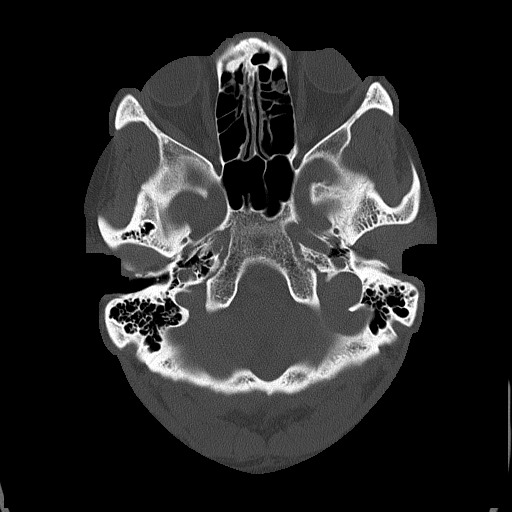
[im 18/88  bone]
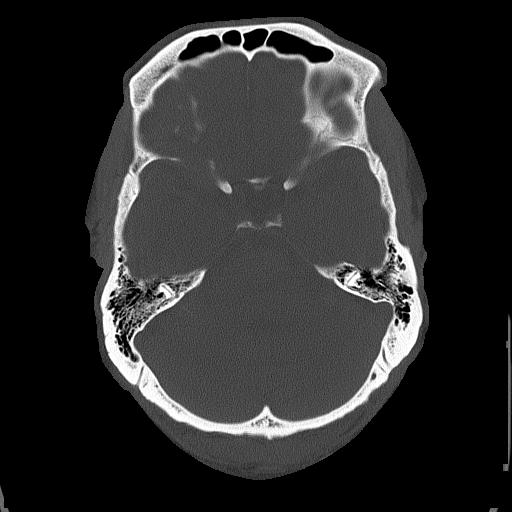
[im 27/88  bone]
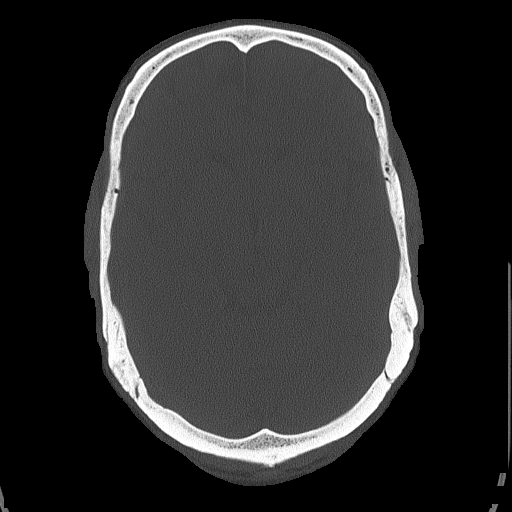
[im 40/88  bone]
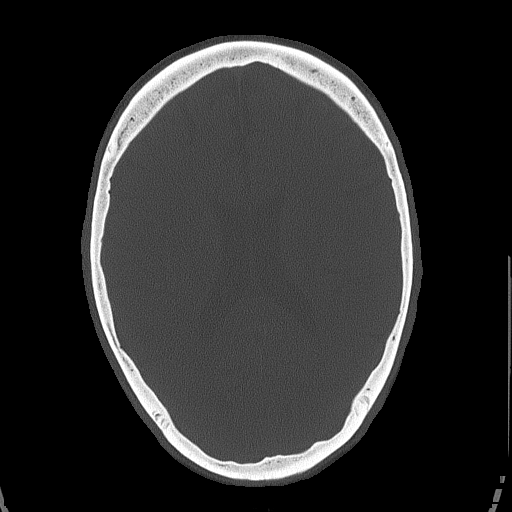

[Series 5: head without cor · coronal · non-contrast · 0.33mm/px · 3 of 67 slices shown]
[im 23/67  brain]
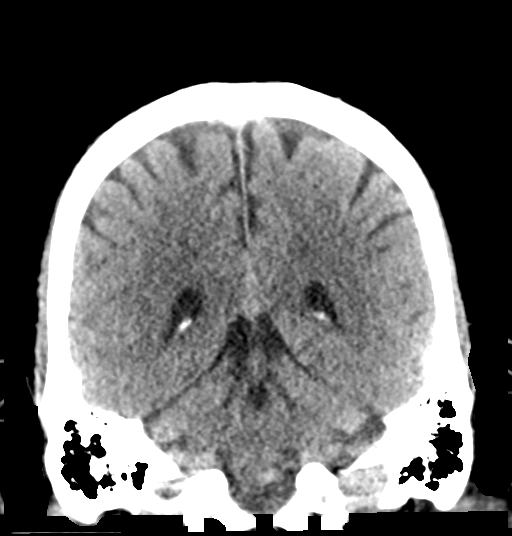
[im 30/67  brain]
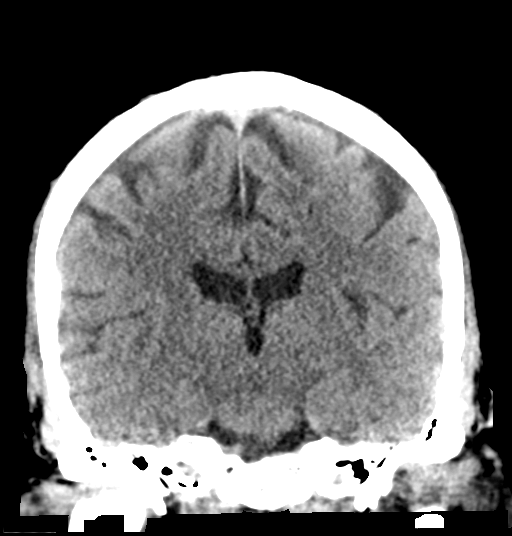
[im 37/67  brain]
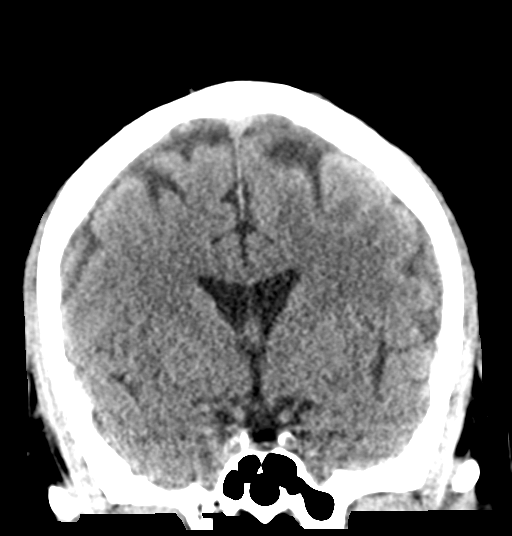

[Series 6: head without sag · sagittal · non-contrast · 0.34mm/px · 3 of 67 slices shown]
[im 23/67  brain]
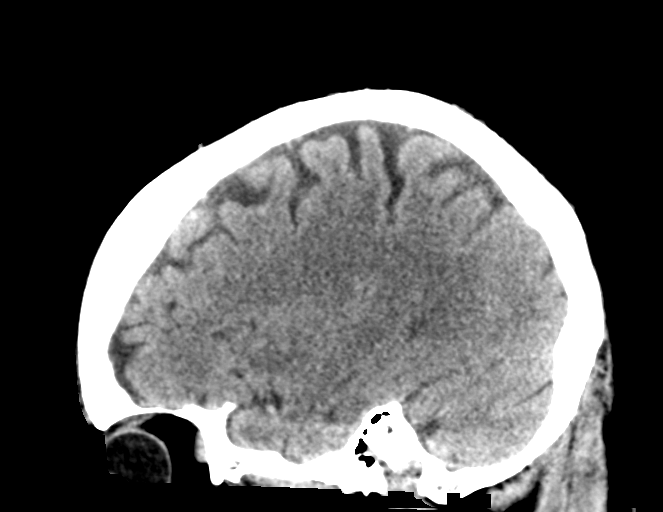
[im 34/67  brain]
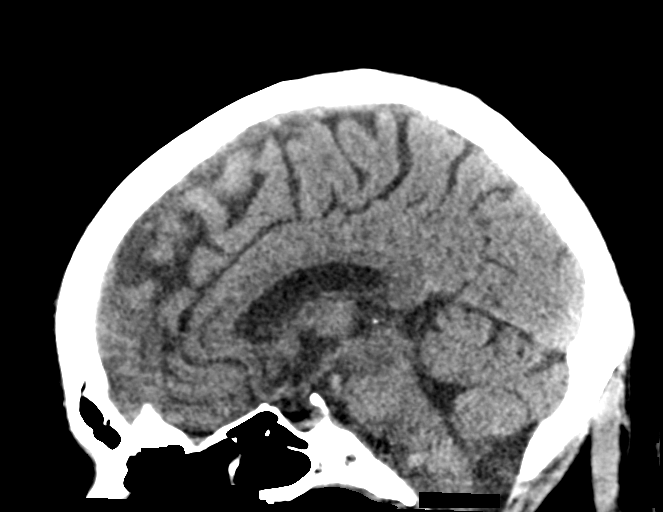
[im 45/67  brain]
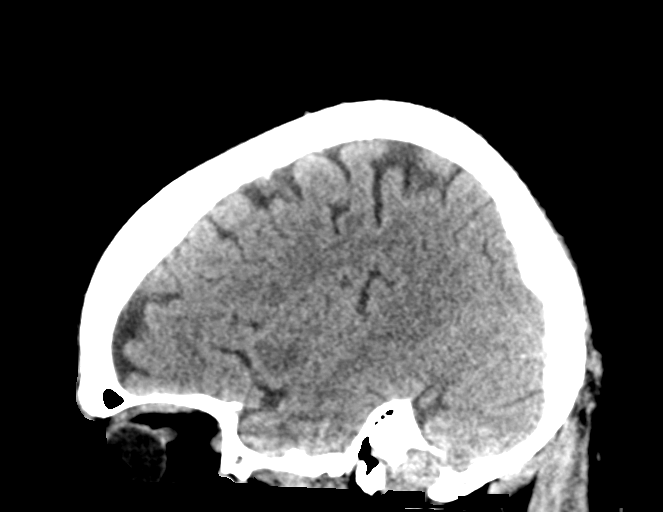

[17 of 47 positions shown; findings below may reference images not displayed]

FINDINGS: Brain: No evidence of acute infarction, hemorrhage, hydrocephalus,
extra-axial collection, or mass lesion/mass effect.

Vascular:  No hyperdense vessel or other acute findings.

Skull: No evidence of fracture or other significant bone
abnormality.

Sinuses/Orbits:  No acute findings.

Other: None.
IMPRESSION: Negative noncontrast head CT.
